# Patient Record
Sex: Male | Born: 1985 | Race: White | Hispanic: No | Marital: Single | Smoking: Never smoker
Health system: Southern US, Community
[De-identification: ages and names within clinical notes are randomized; demographics above are authoritative.]

## PROBLEM LIST (undated history)

## (undated) DIAGNOSIS — K219 Gastro-esophageal reflux disease without esophagitis: Secondary | ICD-10-CM

## (undated) DIAGNOSIS — F909 Attention-deficit hyperactivity disorder, unspecified type: Secondary | ICD-10-CM

## (undated) HISTORY — PX: TONSILLECTOMY: SUR1361

## (undated) HISTORY — PX: CHOLECYSTECTOMY: SHX55

---

## 2004-11-13 ENCOUNTER — Ambulatory Visit: Payer: Self-pay | Admitting: Pediatrics

## 2005-03-01 ENCOUNTER — Emergency Department (HOSPITAL_COMMUNITY): Admission: EM | Admit: 2005-03-01 | Discharge: 2005-03-01 | Payer: Self-pay | Admitting: Emergency Medicine

## 2005-05-05 ENCOUNTER — Ambulatory Visit: Payer: Self-pay | Admitting: Pediatrics

## 2005-05-25 ENCOUNTER — Emergency Department (HOSPITAL_COMMUNITY): Admission: EM | Admit: 2005-05-25 | Discharge: 2005-05-25 | Payer: Self-pay | Admitting: Emergency Medicine

## 2005-09-15 ENCOUNTER — Ambulatory Visit: Payer: Self-pay | Admitting: Pediatrics

## 2005-11-07 ENCOUNTER — Inpatient Hospital Stay (HOSPITAL_COMMUNITY): Admission: EM | Admit: 2005-11-07 | Discharge: 2005-11-10 | Payer: Self-pay | Admitting: Emergency Medicine

## 2005-11-09 ENCOUNTER — Encounter (INDEPENDENT_AMBULATORY_CARE_PROVIDER_SITE_OTHER): Payer: Self-pay | Admitting: *Deleted

## 2006-01-28 ENCOUNTER — Ambulatory Visit: Payer: Self-pay | Admitting: Pediatrics

## 2006-07-01 ENCOUNTER — Ambulatory Visit (HOSPITAL_BASED_OUTPATIENT_CLINIC_OR_DEPARTMENT_OTHER): Admission: RE | Admit: 2006-07-01 | Discharge: 2006-07-02 | Payer: Self-pay | Admitting: Otolaryngology

## 2006-07-01 ENCOUNTER — Encounter (INDEPENDENT_AMBULATORY_CARE_PROVIDER_SITE_OTHER): Payer: Self-pay | Admitting: *Deleted

## 2006-08-16 ENCOUNTER — Ambulatory Visit: Payer: Self-pay | Admitting: Pediatrics

## 2007-01-10 ENCOUNTER — Ambulatory Visit: Payer: Self-pay | Admitting: Pediatrics

## 2007-03-17 IMAGING — CR DG ABDOMEN ACUTE W/ 1V CHEST
3 series · 3 of 3 positions shown · non-contrast
Comparison: none

CLINICAL DATA: Abdominal pain.
 ACUTE ABDOMINAL SERIES WITH CHEST ? 11/07/2005 ? ([DATE] HOURS):

[w chest pa]
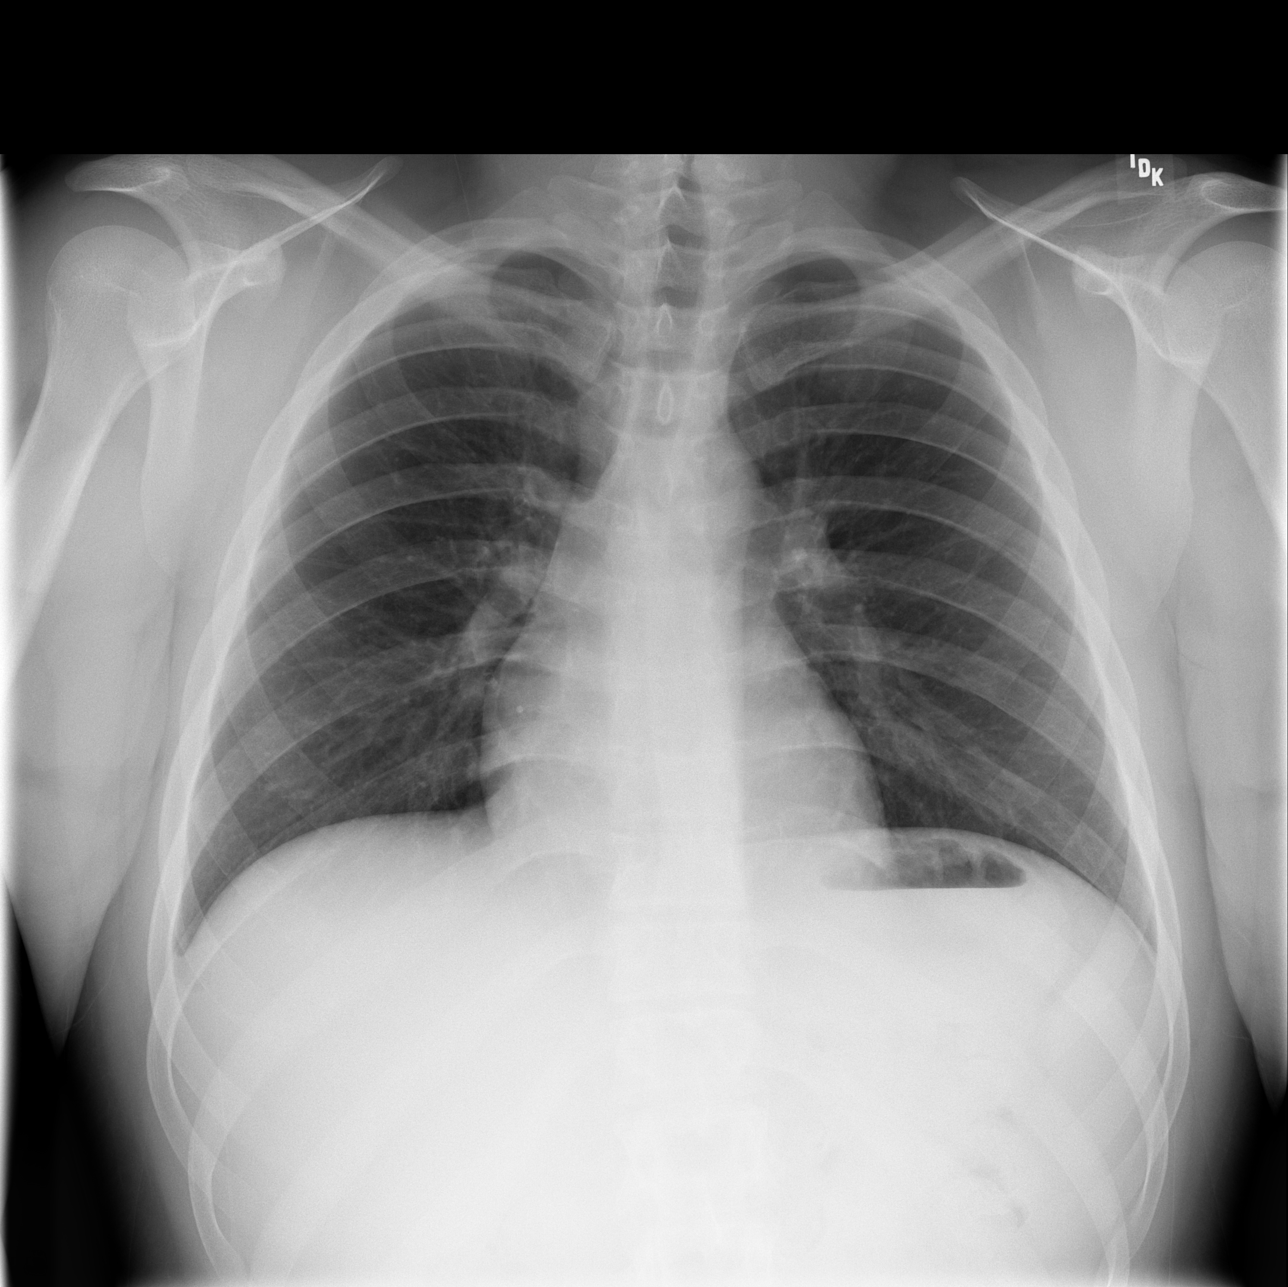

[w abdomen upright]
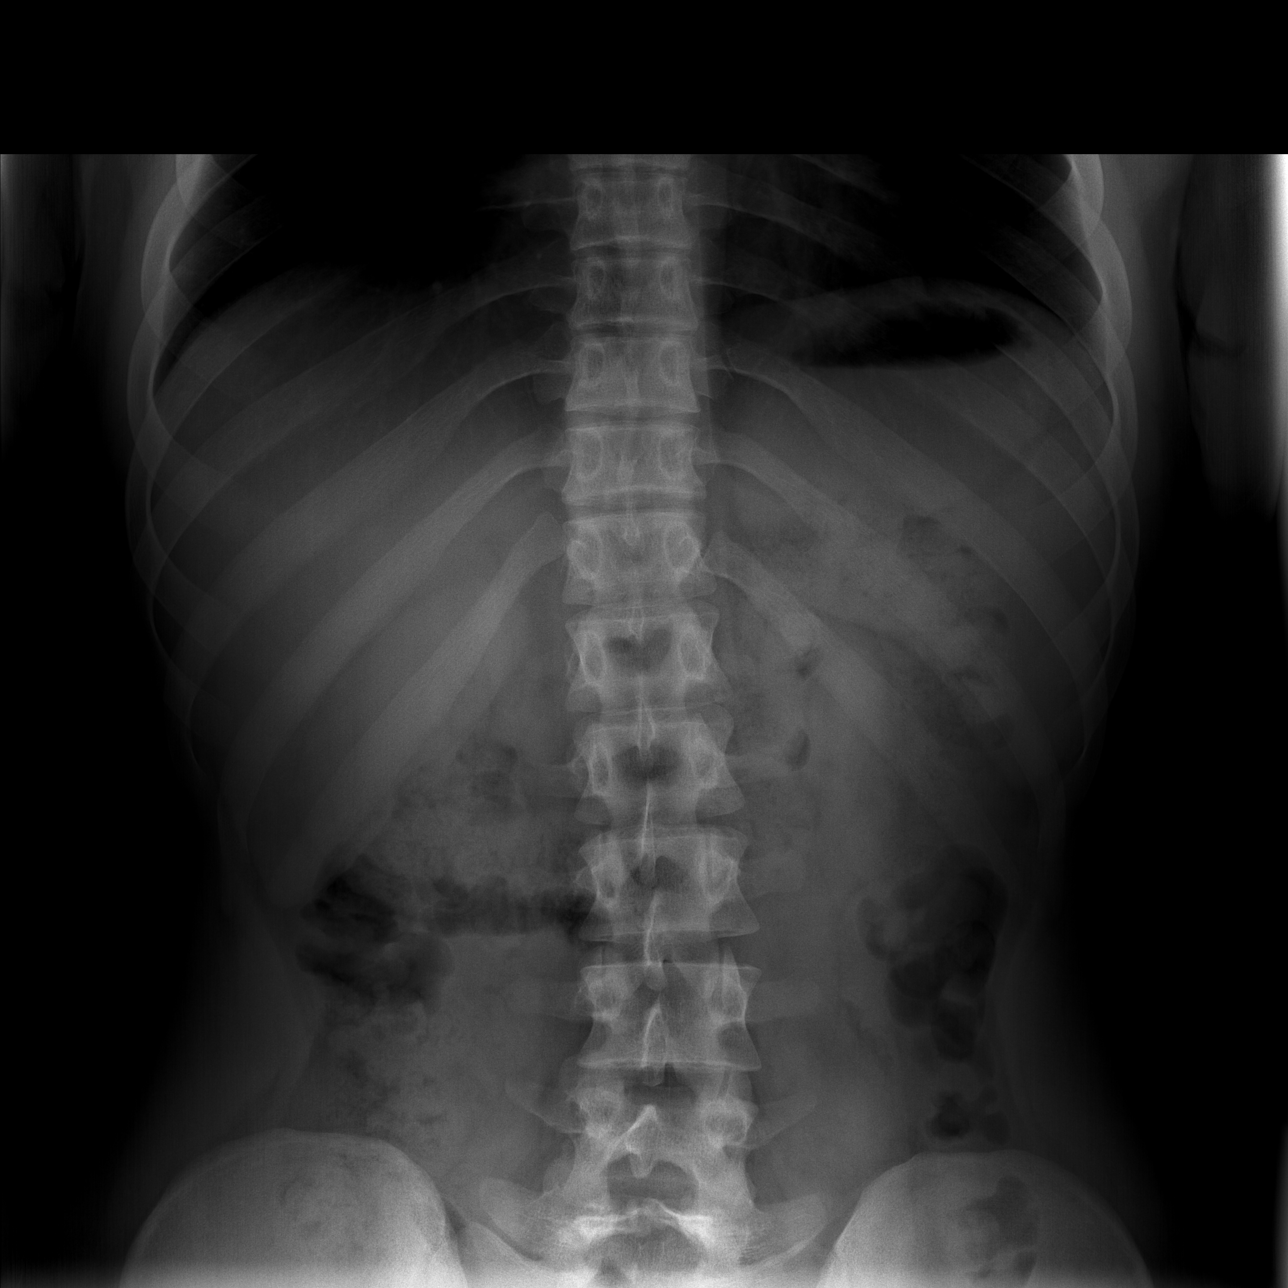

[t abdomen supine]
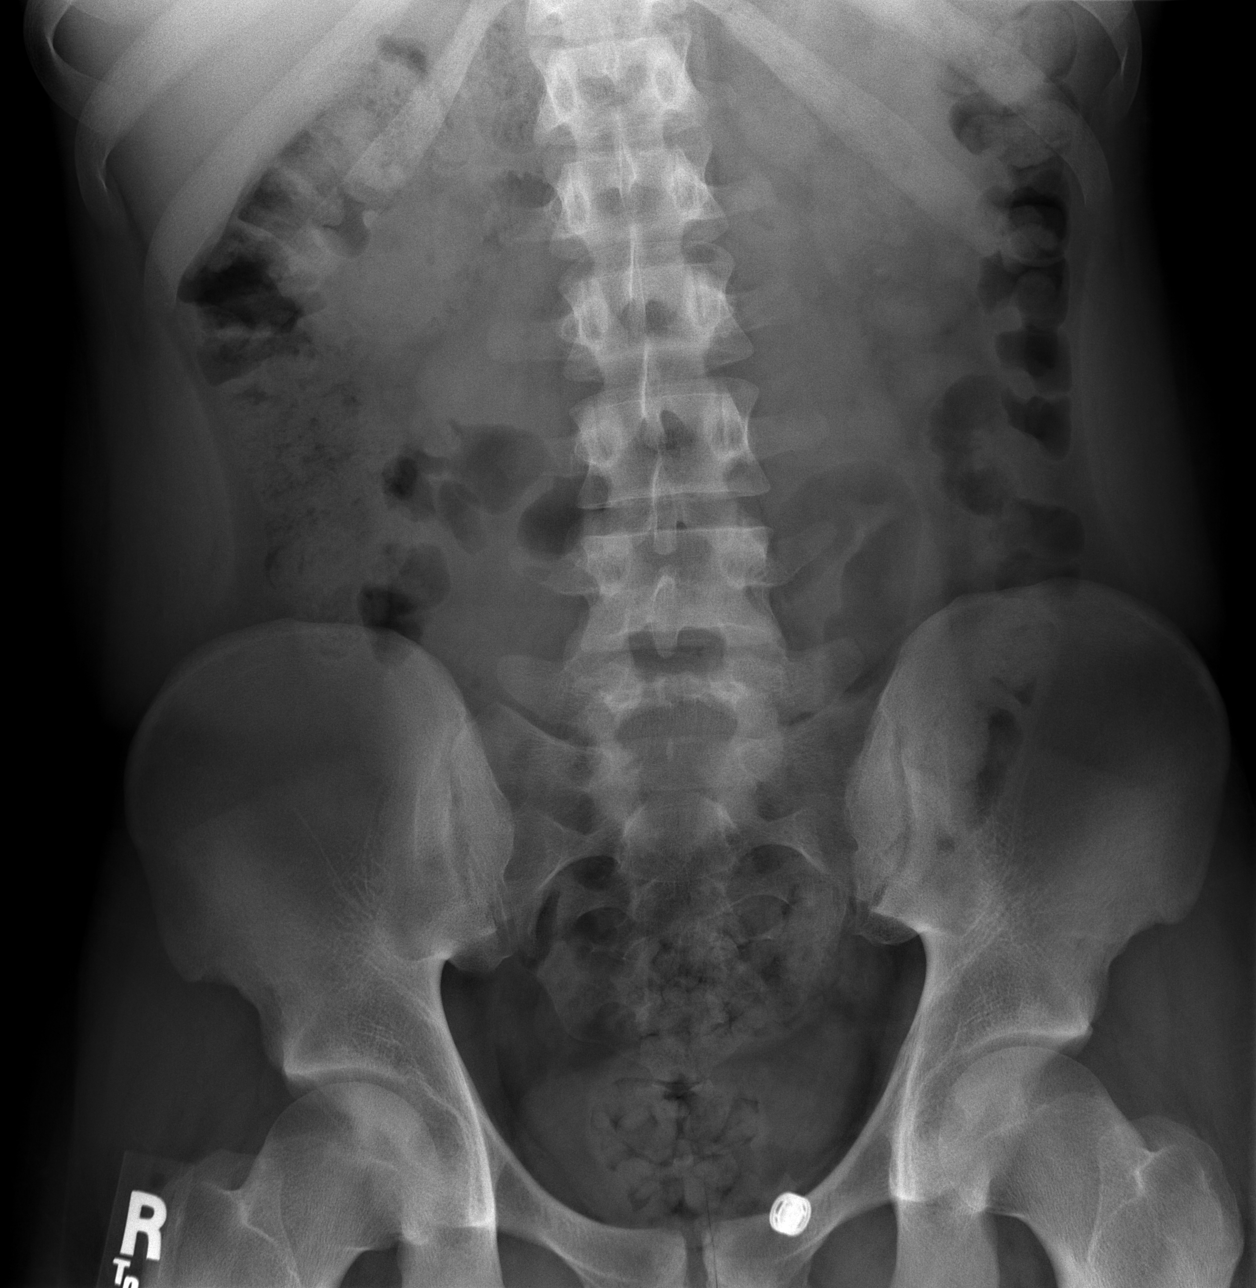

[3 of 3 positions shown; findings below may reference images not displayed]

FINDINGS: The heart is normal in size.  The lungs are clear.  No pneumothoraces.
 The abdomen, non-dilated gas-filled loops of small and large bowel are seen.  There are disproportionately dilated loops of bowel or air-fluid levels.  There is no free intraperitoneal gas.  Transitional anatomy is present at the lumbosacral junction.
IMPRESSION: No acute cardiopulmonary disease.  Normal bowel gas pattern.

## 2007-03-18 IMAGING — RF DG ERCP WO/W SPHINCTEROTOMY
1 series · 1 of 1 positions shown · non-contrast
Comparison: none

CLINICAL DATA: Cholecystitis.  Pancreatitis.  
 ERCP AND SPHINCTEROTOMY:

[Series 2: run · 1 of 1 slices shown]
[im 1/1]
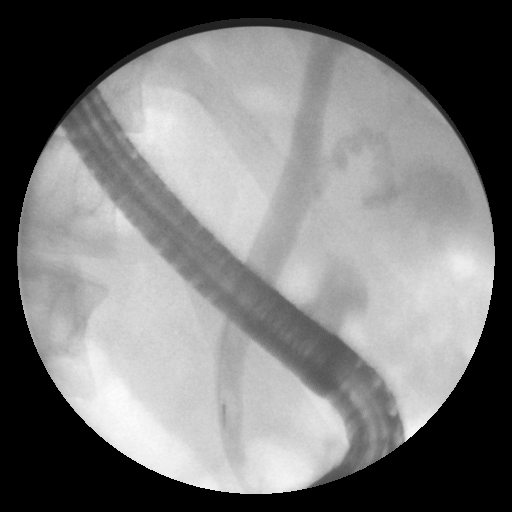

[1 of 1 positions shown; findings below may reference images not displayed]

FINDINGS: A single spot image is being interpreted.  Contrast is seen filling the common duct and cystic duct.  No definite common duct stones are seen.
IMPRESSION: ERCP as described.

## 2007-03-19 IMAGING — RF DG CHOLANGIOGRAM OPERATIVE
1 series · 5 of 5 positions shown · non-contrast
Comparison: none

CLINICAL DATA: Intraoperative.
 INTRAOPERATIVE CHOLANGIOGRAM ? 11/09/05:
TECHNIQUE: Multiple fluoroscopic spot radiographs were obtained during intraoperative cholangiogram, and are submitted for interpretation post-operatively.  C-arm run from the operating room is reviewed on PACS.

[Series 1: run · 2 acquisitions, 5 frames shown]
[im 1/2]
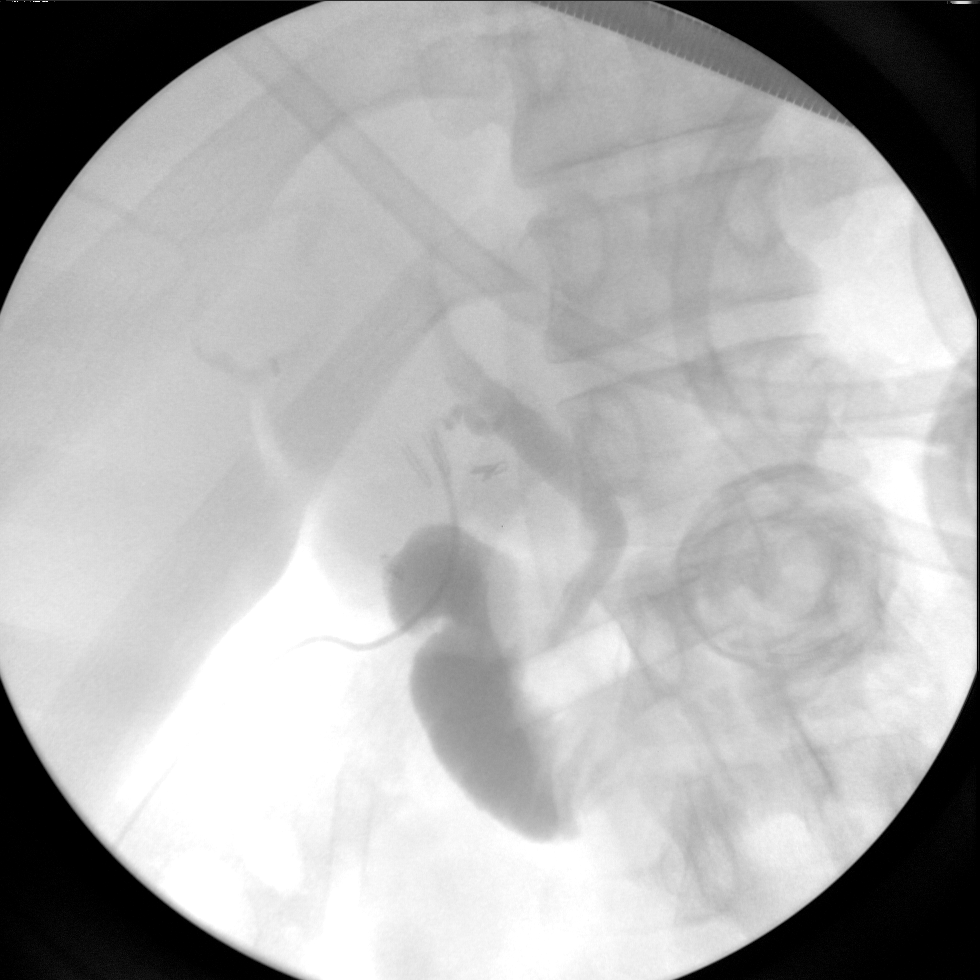
[im 1/2]
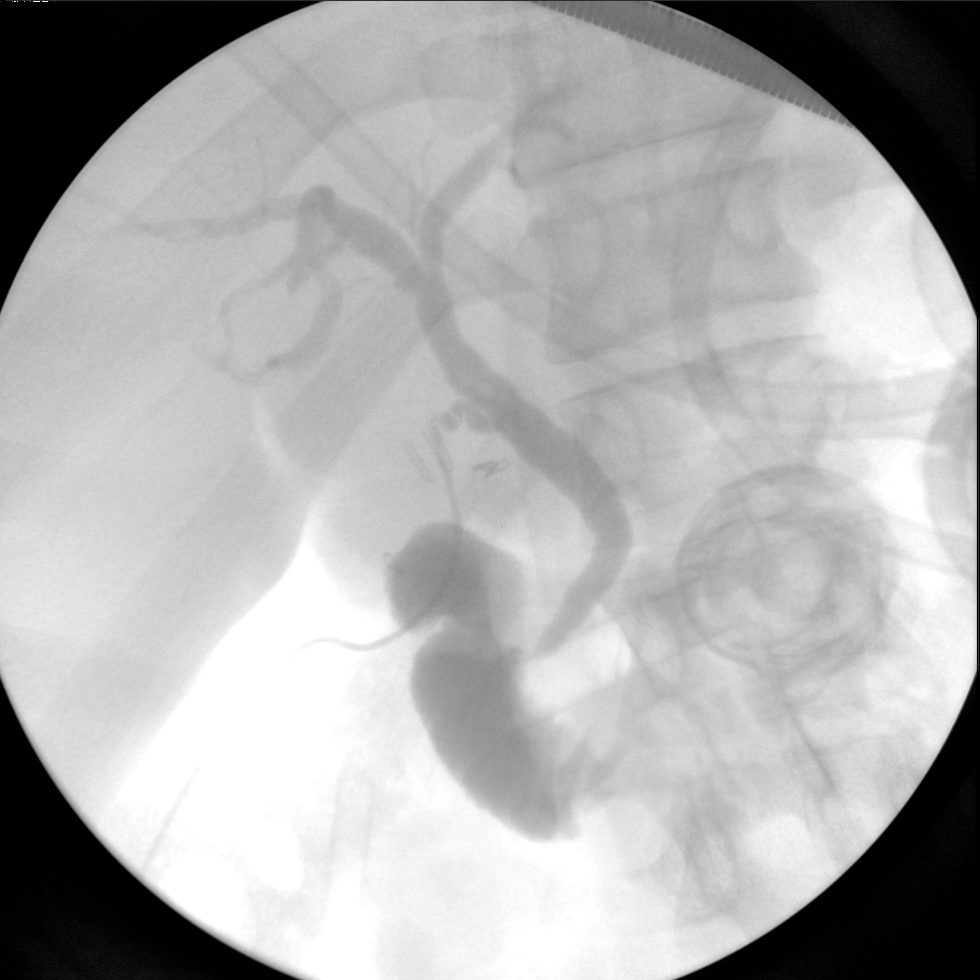
[im 1/2]
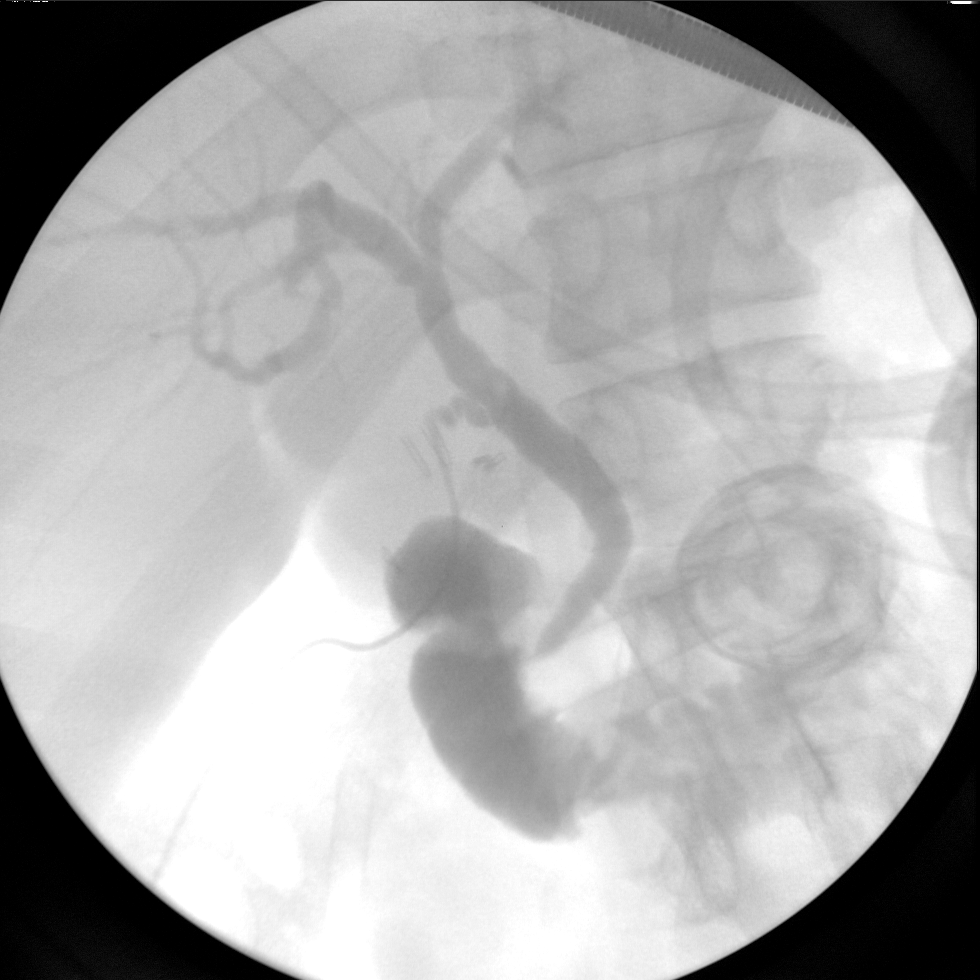
[im 1/2]
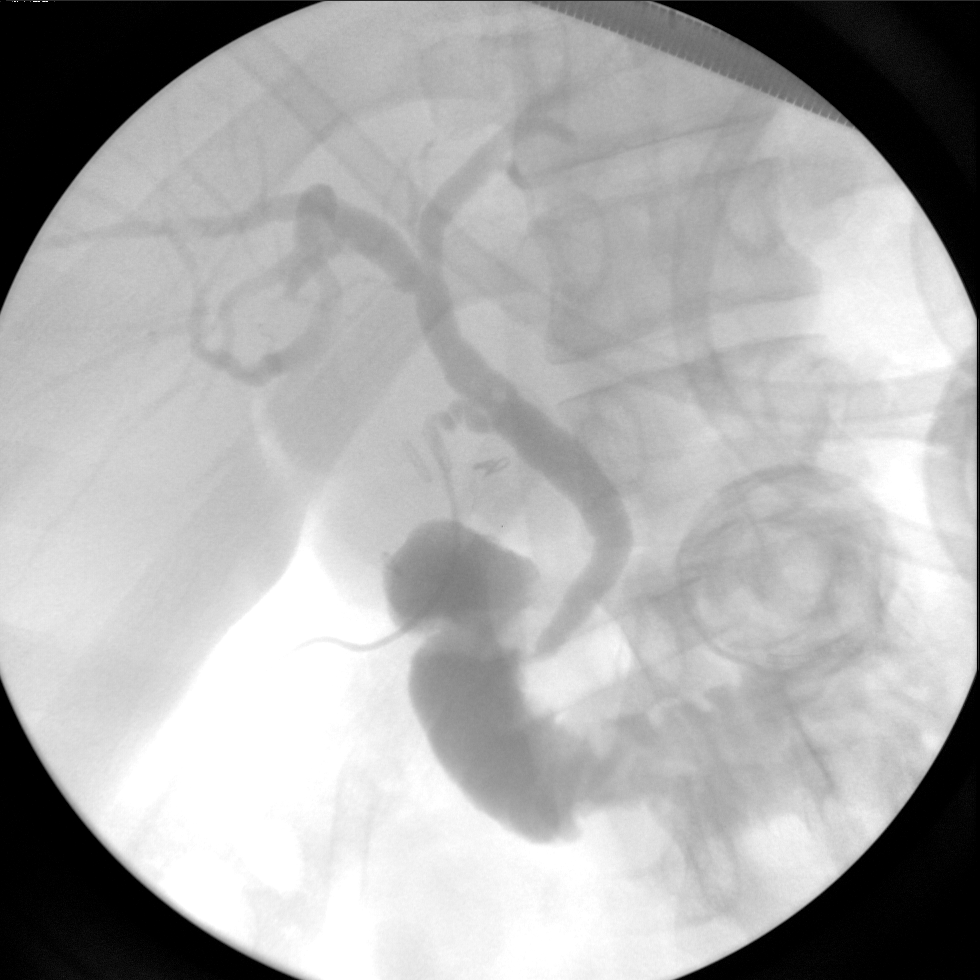
[im 2/2]
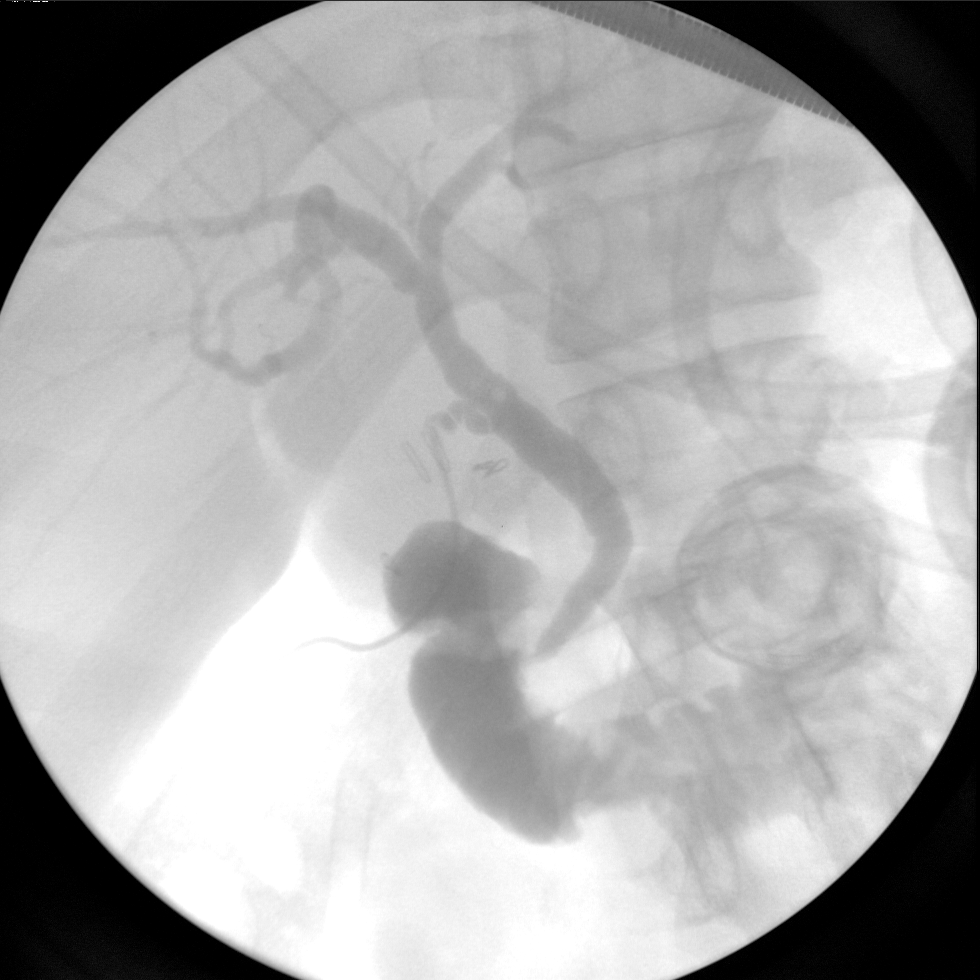

[5 of 5 positions shown; findings below may reference images not displayed]

FINDINGS: Intra- and extrahepatic biliary ducts are within normal limits.  There are one or more lucencies near the cystic duct insertion site.  These are most likely air bubbles.  I cannot exclude small stones.  There is no biliary obstruction.
IMPRESSION: There are several small lucencies noted in the biliary tree near the cystic duct ? likely air bubbles but cannot rule out several small stones.

## 2007-06-21 ENCOUNTER — Ambulatory Visit: Payer: Self-pay | Admitting: Pediatrics

## 2007-11-10 ENCOUNTER — Ambulatory Visit: Payer: Self-pay | Admitting: Pediatrics

## 2008-04-12 ENCOUNTER — Ambulatory Visit: Payer: Self-pay | Admitting: *Deleted

## 2008-08-02 ENCOUNTER — Ambulatory Visit: Payer: Self-pay | Admitting: *Deleted

## 2008-10-09 ENCOUNTER — Ambulatory Visit: Payer: Self-pay | Admitting: *Deleted

## 2009-03-06 ENCOUNTER — Ambulatory Visit: Payer: Self-pay | Admitting: Pediatrics

## 2009-05-28 ENCOUNTER — Ambulatory Visit: Payer: Self-pay | Admitting: Pediatrics

## 2009-09-03 ENCOUNTER — Ambulatory Visit: Payer: Self-pay | Admitting: Pediatrics

## 2009-10-03 ENCOUNTER — Ambulatory Visit: Payer: Self-pay | Admitting: Pediatrics

## 2010-01-08 ENCOUNTER — Ambulatory Visit: Payer: Self-pay | Admitting: Pediatrics

## 2010-04-09 ENCOUNTER — Ambulatory Visit: Payer: Self-pay | Admitting: Pediatrics

## 2010-09-11 ENCOUNTER — Ambulatory Visit: Payer: Self-pay | Admitting: Pediatrics

## 2011-04-24 NOTE — Consult Note (Signed)
[name], [name]               ACCOUNT NO.:  [id number]   MEDICAL RECORD NO.:  [id number]          PATIENT TYPE:  INP   LOCATION:  5729                         FACILITY:  [facility]   PHYSICIAN:  John C. Madilyn Fireman, M.D.    DATE OF BIRTH:  1986/08/01   DATE OF CONSULTATION:  11/08/2005  DATE OF DISCHARGE:                                   CONSULTATION   REASON FOR CONSULTATION:  Suspected common bile duct stone.   HISTORY OF ILLNESS:  The patient is a 25 year old white male, who comes to  the Emergency Room with epigastric pain that awoke him from sleep in the  middle of the night, and it was persistent.  He had an abdominal ultrasound,  which showed gallstones, a slightly prominent gallbladder wall with common  bile duct dilatation.  His LFTs originally showed bilirubin 0.7, alk phos  65, SGPT 47, SGOT 66.  On repeat this morning, bilirubin had risen to 3.38,  FT 116, ALT 139.  Lipase was originally slightly high at ____________  antibodies were 46.  ERCP is requested for suspected common bile duct or  conduct a stone test.  The patient is currently pain free.  His white blood  cell count was 10,300 on admission, afebrile.   PAST MEDICAL HISTORY:  ADHD.   MEDICATIONS:  Adoral.   SURGICAL:  None.   SOCIAL HISTORY:  The patient denies alcohol or tobacco use.   FAMILY HISTORY:  Non-contributory.   PHYSICAL EXAMINATION:  GENERAL:  Well-developed, well-nourished white male  in no acute distress.  HEART:  Regular rate and rhythm.  No murmur.  LUNGS:  Clear.  ABDOMEN:  Soft, non-distended, normoactive bowel sounds. ___________.   IMPRESSION:  Cholelithiasis, possible cholecystitis and suspected common  bile duct stone.   PLAN:  Agree with the ERCP.  Risks/rationale were mentioned and alternatives  were explained to the patient, and he agrees to proceed.  This will be done  later today.           ______________________________  Everardo All. Madilyn Fireman, M.D.     JCH/MEDQ  D:  11/08/2005   T:  11/09/2005  Job:  [id number]   cc:   Ollen Gross. Vernell Morgans, M.D.  [address]  [place]  [place] [address]

## 2011-04-24 NOTE — H&P (Signed)
[name], [name]               ACCOUNT NO.:  [id number]   MEDICAL RECORD NO.:  [id number]          PATIENT TYPE:  INP   LOCATION:  5729                         FACILITY:  [facility]   PHYSICIAN:  Ollen Gross. Vernell Morgans, M.D. DATE OF BIRTH:  21-Feb-1986   DATE OF ADMISSION:  11/07/2005  DATE OF DISCHARGE:                                HISTORY & PHYSICAL   [name] is a 25 year old white male who presents to the emergency  department today with severe epigastric pain that started this morning.  He  has not really had any nausea or vomiting associated with it but the pain  has been very severe.  He denies any nausea, vomiting, fever, chills, chest  pain, shortness of breath, diarrhea, or dysuria.  He has had two similar  episodes of pain in the last year.   REVIEW OF SYSTEMS:  The rest of his review of systems are unremarkable.   PAST MEDICAL HISTORY:  None.   PAST SURGICAL HISTORY:  None.   MEDICATIONS:  Adderall XR 60 mg a day.   ALLERGIES:  No known drug allergies.   SOCIAL HISTORY:  He denies the use of alcohol or tobacco products.   FAMILY HISTORY:  Noncontributory.   PHYSICAL EXAMINATION:  VITAL SIGNS:  His temp is 97.2, blood pressure  134/82, pulse of 100.  GENERAL:  He is a well-developed, well-nourished, black male in no acute  distress.  SKIN:  Warm and dry.  No jaundice.  EYES:  Extraocular muscles intact.  Pupils are equal, round, and reactive to  light.  Sclerae nonicteric.  LUNGS:  Clear bilaterally with no use of accessory respiratory muscles.  HEART:  Regular rate and rhythm with an impulse in the left chest.  ABDOMEN:  Soft with mild to moderate epigastric tenderness.  No guarding or  peritoneal signs.  No palpable mass or hepatosplenomegaly.  EXTREMITIES:  No cyanosis, clubbing, or edema with good strength in his arms  and legs.  PSYCHOLOGICAL:  He is alert and oriented x3 with no evidence of today of  anxiety or depression.   On review of his ultrasound, he  was noted to have stones in his gallbladder  and possibly some slight common duct dilatation but no gallbladder wall  thickening.   LABORATORY DATA:  Significant for a white count of 10.2.  SGOT of 66, SGPT  of 47, alk phos of 65, total bili 0.7.  Lipase 76.   ASSESSMENT:  This is a 25 year old white male with what sounds like a mild  case of gallstone pancreatitis.   PLAN:  1.  We will plan to admit him for IV hydration, pain control, and bowel      rest.  2.  We will recheck his liver functions and amylase and lipase in the      morning and if these have resolved, then he may benefit from      cholecystectomy during this admission.  I have discussed the risks and      benefits of the surgery as well as some of the technical aspects of  removing the gallbladder with him and he understands and agrees with      this treatment plan.      Ollen Gross. Vernell Morgans, M.D.  Electronically Signed     PST/MEDQ  D:  11/07/2005  T:  11/07/2005  Job:  [id number]

## 2011-04-24 NOTE — Op Note (Signed)
NAMECLARKSON, [name]               ACCOUNT NO.:  [id number]   MEDICAL RECORD NO.:  [id number]          PATIENT TYPE:  AMB   LOCATION:  DSC                          FACILITY:  [facility]   PHYSICIAN:  Jefry H. Pollyann Kennedy, MD     DATE OF BIRTH:  1986/07/16   DATE OF PROCEDURE:  07/01/2006  DATE OF DISCHARGE:                                 OPERATIVE REPORT   PREOPERATIVE DIAGNOSIS:  Chronic tonsillitis.   POSTOPERATIVE DIAGNOSIS:  Chronic tonsillitis.   PROCEDURE:  Tonsillectomy.   SURGEON:  Jefry H. Pollyann Kennedy, M.D.   ANESTHESIA:  General endotracheal anesthesia was used.   COMPLICATIONS:  No complications.   BLOOD LOSS:  None.   FINDINGS:  Large tonsils, with deep cryptic spaces, and a large amount of  tonsillolithiasis in the right tonsil.   REFERRING PHYSICIAN:  Darius Bump, M.D.   HISTORY:  A 25 year old with a history of longstanding chronic and recurring  tonsillopharyngitis, streptococcal and non-streptococcal.  Risks, benefits,  alternatives, and complications of procedure were explained to the patient  and his family, who seemed to understand and agreed with surgery.   PROCEDURE:  The patient was taken to the operating room and placed on the  operating table in the supine position.  Following induction of general  endotracheal anesthesia, the table was turned, and the patient was draped in  standard fashion.  A Crowe-Davis mouth gag was inserted into the oral  cavity, used to retract the tongue and mandible, and attached to the Mayo  stand.  Red rubber catheter was inserted into the right side of the nose,  withdrawn through the mouth, and used to retract the soft palate and uvula.  Tonsillectomy was performed using electrocautery dissection, carefully  dissecting the avascular plane between the capsule and constrictor muscles.  Cautery was used in certain spots to provide completion of hemostasis.  The  tonsils were sent together for pathologic evaluation.  Pharynx was  irrigated  with saline and aspirated, and an orogastric tube used to aspirate the  contents of the stomach.  The patient was then awakened, extubated, and  transferred to recovery in stable condition.      Jefry H. Pollyann Kennedy, MD  Electronically Signed     JHR/MEDQ  D:  07/01/2006  T:  07/01/2006  Job:  [id number]   cc:   Darius Bump, M.D.

## 2011-04-24 NOTE — Discharge Summary (Signed)
[name], [name]               ACCOUNT NO.:  [id number]   MEDICAL RECORD NO.:  [id number]          PATIENT TYPE:  INP   LOCATION:  5729                         FACILITY:  [facility]   PHYSICIAN:  Angelia Mould. Derrell Lolling, M.D.DATE OF BIRTH:  03-31-1986   DATE OF ADMISSION:  11/07/2005  DATE OF DISCHARGE:  11/10/2005                                 DISCHARGE SUMMARY   FINAL DIAGNOSES:  1.  Biliary pancreatitis.  2.  Acute and chronic cholecystitis with cholelithiasis.   PROCEDURE:  Laparoscopic cholecystectomy with cholangiogram on November 09 2005.   HISTORY OF PRESENT ILLNESS:  This is a 25 year old white man who presented  to the [facility] emergency room with epigastric pain that started several hours  earlier in the day.  He is otherwise healthy.   PHYSICAL EXAMINATION:  GENERAL:  He was found to be alert, to be healthy-  appearing but in mild-to-moderate distress.  ABDOMEN:  Exam revealed moderate epigastric tenderness and guarding.  [name] SIGNS:  [name] signs were stable.   HOSPITAL COURSE:  The patient was admitted.  Lab work revealed mild  elevation of liver function tests and mild elevation of lipase.  Abdominal  ultrasound showed gallstones and thickening of the gallbladder wall.  The  pancreas was not well-seen.  The common bile duct was dilated.  He was  thought to have possible pancreatitis and possible acute cholecystitis  associated with his gallstones.  He was admitted, placed on antibiotics, and  bowel rest.  The following morning, he was reevaluated.  Pain had improved.  Bilirubin had gone from 0.7 to 3.3, and his other liver function tests were  higher as well, although the lipase had gone from 76 down to 17.  A GI  consult was obtained with Dr. Dorena Cookey.  An ERCP was performed and noted  that the papilla looked somewhat abnormal, suggesting that maybe he passed a  stone through the ampulla.  He did do a cholangiogram which showed two small  filling defects and thought  these were air bubbles.  A sphincterotomy was  performed.  He felt that the procedure went well, and there were no apparent  post-procedure complications.   The patient was taken to the operating room on November 09, 2005 and  underwent a laparoscopic cholecystectomy with cholangiogram.  In the  cholangiogram, there was one air bubble, but we did not see any filling  defect or obstruction.  The final pathology report showed acute  cholecystitis with cholelithiasis.   Postoperatively, the patient did well.  On November 10, 2005, he was feeling  better, tolerated a diet, was stable, was afebrile, the abdomen was soft and  benign.  Lab work showed his white count was 10,500, total bilirubin was  down to 1.2.   DISPOSITION:  He was discharged home on November 10, 2005, feeling well.   DIET AND ACTIVITY:  Diet and activities were discussed.   DISCHARGE MEDICATIONS:  He was given a prescription for analgesics.   FOLLOW UP:  He was asked to return to the office in three weeks.      [name]  Grayce Sessions, M.D.  Electronically Signed     HMI/MEDQ  D:  02/04/2006  T:  02/04/2006  Job:  [id number]   cc:   Everardo All. Madilyn Fireman, M.D.  Fax: [phone]

## 2011-04-24 NOTE — Op Note (Signed)
[name], [name]               ACCOUNT NO.:  [id number]   MEDICAL RECORD NO.:  [id number]          PATIENT TYPE:  INP   LOCATION:  5729                         FACILITY:  [facility]   PHYSICIAN:  Angelia Mould. Derrell Lolling, M.D.DATE OF BIRTH:  November 26, 1986   DATE OF PROCEDURE:  11/09/2005  DATE OF DISCHARGE:  11/10/2005                                 OPERATIVE REPORT   PREOPERATIVE DIAGNOSIS:  Acute cholecystitis with cholelithiasis, suspect  choledocholithiasis.   POSTOPERATIVE DIAGNOSIS:  Acute cholecystitis with cholelithiasis, suspect  choledocholithiasis.   OPERATION PERFORMED:  Laparoscopic cholecystectomy with intraoperative  cholangiogram.   SURGEON:  Angelia Mould. Derrell Lolling, M.D.   FIRST ASSISTANT:  Cherylynn Ridges, M.D.   OPERATIVE INDICATIONS:  This is a 25 year old white man who was admitted to  this hospital on November 07, 2005 with a less than 24-hour history of  epigastric pain. He had mild elevation of his liver function test and his  serum lipase. His ultrasound showed a thickened gallbladder wall with  numerous gallstones. He was admitted and initially treated conservatively  with thoughts that he might have gallstone pancreatitis. Yesterday his lab  work showed that lipase had returned to normal, but his bilirubin had gone  up to 3.3. Dr. Dorena Cookey saw him and performed an ERCP and sphincterotomy.  He never saw any stones and thought there was loss of air bubbles up in the  common duct. The patient became combative and they abandoned the ERCP  without doing a post procedure cholangiogram. This morning the patient was  asymptomatic denying pain and stating that he was hungry his bilirubin and  fallen down to about 2.5. He was brought to operating room for a  cholecystectomy.   OPERATIVE FINDINGS:  The gallbladder was unusually positioned, in that it  was immediately under the falciform ligament, making it much more medial  than usual. The gallbladder was thick-walled, acutely  inflamed, and  contained numerous stones. The cholangiogram was normal showing normal  intrahepatic and extrahepatic bile ducts, no obstruction with good flow of  contrast into duodenum, and the way filling defect we saw was a small air  bubble. The liver looked healthy. The stomach and duodenum and small  intestine and large intestine looked normal.   OPERATIVE TECHNIQUE:  Following the induction of general endotracheal  anesthesia the patient's abdomen was prepped and draped in a sterile  fashion. Marcaine 0.5%  was used as an infiltration anesthetic. A vertically  oriented incision was made at the upper rim of the umbilicus. The fascia was  incised in midline and the abdominal cavity entered under direct vision. A  10-mm Hassan trocar was inserted and secured with a pursestring suture of #0  Vicryl. Pneumoperitoneum was created. Video camera was inserted with  visualization and findings as described above. An 11-mm trocar was placed in  the subxiphoid region and two 5-mm trocars placed in the right mid abdomen.   The gallbladder was elevated. There were extensive adhesions to the  gallbladder. These had to be taken down very carefully. We ultimately were  able to dissect out the infundibulum of  the gallbladder and the cystic duct.  We created a nice window behind the cystic duct. The cholangiogram catheter  was inserted into the cystic duct. A cholangiogram was obtained using the C-  arm. The cholangiogram showed normal intrahepatic and extrahepatic biliary  anatomy, no obstruction with good flow of contrast into the duodenum; and  the only filling defect we saw was one tiny air bubble. We did not see any  stones. The cholangiogram catheter was removed. The cystic duct was secured  with multiple metal clips and divided. We dissected out the cystic artery,  secured it with multiple metal clips, and divided it. We then dissected the  gallbladder from its bed with electrocautery. The  dissection plane was very  difficult because of thickening of the gallbladder wall. We got into the  liver substance a couple of times and had some venous bleeders, which were  controlled with electrocautery and Surgicel. Ultimately the gallbladder was  completely dissected out; placed in the specimen bag and removed.   We spent about 10 or 15 minutes irrigating out all the fluid under the liver  and above the liver until the irrigation fluid was completely clear. We  carefully inspected the bed of the gallbladder and the area, where we had  secured the cystic duct and cystic artery, and it seemed to perfectly fine;  there was no bleeding or bile leak whatsoever. Because we had cauterized a  fairly significant raw surface of the liver and the gallbladder bed, we  placed a piece of Surgicel gauze in the gallbladder bed and waited for about  5 minutes. There was no bleeding. The pneumoperitoneum was released. The  trocars were removed under direct vision; and there was no bleeding from  trocar sites. The fascia at the umbilicus was closed with #0 Vicryl suture.  The skin incisions were irrigated out saline, and closed with subcuticular  sutures of 4-0 Monocryl and Steri-Strips. Clean bandages were placed; and  the patient taken to the recovery room in stable condition. Estimated blood  loss about 30 mL. Complications none.  Sponge and instrument counts were  correct.      Angelia Mould. Derrell Lolling, M.D.  Electronically Signed     HMI/MEDQ  D:  11/09/2005  T:  11/10/2005  Job:  [id number]   cc:   Everardo All. Madilyn Fireman, M.D.  Fax: [phone]

## 2011-04-24 NOTE — Op Note (Signed)
[name], [name]               ACCOUNT NO.:  [id number]   MEDICAL RECORD NO.:  [id number]          PATIENT TYPE:   LOCATION:                                 FACILITY:   PHYSICIAN:  John C. Madilyn Fireman, M.D.    DATE OF BIRTH:  1986-01-30   DATE OF PROCEDURE:  11/07/2005  DATE OF DISCHARGE:                                 OPERATIVE REPORT   PROCEDURE:  _________   INDICATIONS FOR PROCEDURE:  Cholelithiasis with possible cholecystitis,  elevated liver function tests, question of a common bile duct stone.   PROCEDURE:  The patient was placed in the prone position and placed on a  pulse monitor with continuous low flow oxygen by nasal cannula. He was  sedated with 150 mcg of IV Fentanyl and 12.5 mg of IV Versed as well as  __________ Phenergan.  Olympus video side-viewing endoscope was passed  __________ the oropharynx into the esophagus and stomach. The patient was  __________   __________   The stomach appeared grossly normal.  The ampulla of Vater was located on  the __________ wall.  __________ ampulla of Vater located on the __________  wall had somewhat traumatized look, suggesting the possibility of stone  passed through it.  Intermediate cannulation was obtained ___________  cholangiogram obtained.  I could not initially pass the wire high or freely  into the common bile duct and could not get a wire to advance initially  behind the tip of the cannula, but a selective cholangiogram was obtained  revealing a couple of __________ filling defects.  These appeared to  increase in number with subsequent injection of __________ deep cannulation  and it was felt that some or most of these were almost certainly air  bubbles.  Finally we obtained deep cannulation with guide wire and performed  a large sphincterotomy.  Some of the filling defects that were seen in the  distal duct were seen to come through the papilla as air bubbles, but no  stones were seen in the pouch.  No  pancreatogram was obtained and dye was  not seen to enter the gallbladder.  The sphincterotome was exchanged for a  balloon catheter but as the balloon was being introduced up into the duct,  the patient ___________ sat up literally, came off the table, dislodging the  catheter, wire and scope back into the stomach.  Thinking that most if not  all of the remaining filling defects were air bubbles and that the  sphincterotomy was probably large enough to allow passage of the remaining  stones, I elected not to give him greater amounts of medication given that  he had had some brief oxygen desaturation during the procedure.  Therefore,  we terminated the procedure.  The scope was withdrawn and the patient  returned to the recovery room in stable condition.  With the above listed  exceptions, he tolerated the procedure well.  There were no __________  complications.   IMPRESSION:  Possible common bile duct stone versus air bubbles with  definite introduction of air bubble during the procedure, status post large  sphincterotomy unable to keep the patient adequately sedated to definitely  clear the duct of stones.   PLAN:  We will discuss with general surgery and recommend a through  intraoperative cholangiogram and attempted __________ of any retained stones  at the time of surgery.  Will also be recheck liver function tests tomorrow.           ______________________________  Everardo All Madilyn Fireman, M.D.     JCH/MEDQ  D:  11/08/2005  T:  11/09/2005  Job:  [id number]

## 2016-03-13 ENCOUNTER — Other Ambulatory Visit: Payer: Self-pay | Admitting: Gastroenterology

## 2017-09-22 ENCOUNTER — Emergency Department
Admission: EM | Admit: 2017-09-22 | Discharge: 2017-09-22 | Disposition: A | Discharge status: Home / Self Care | Payer: BLUE CROSS/BLUE SHIELD | Source: Home / Self Care

## 2017-09-22 DIAGNOSIS — J029 Acute pharyngitis, unspecified: Secondary | ICD-10-CM

## 2017-09-22 HISTORY — DX: Attention-deficit hyperactivity disorder, unspecified type: F90.9

## 2017-09-22 HISTORY — DX: Gastro-esophageal reflux disease without esophagitis: K21.9

## 2017-09-22 MED ORDER — AMOXICILLIN 500 MG PO CAPS: 500.0000 mg | ORAL_CAPSULE | Freq: Three times a day (TID) | ORAL | 0 refills | Status: AC

## 2017-09-22 NOTE — Discharge Instructions (Signed)
Return if any problems.

## 2017-09-22 NOTE — ED Triage Notes (Signed)
Pt c/o cough, congestion, fatigue, and HA since Sunday. Took Mucinex today at noon.

## 2017-09-22 NOTE — ED Provider Notes (Signed)
[name]    CSN: [id number] Arrival date & time: 09/22/17  1435     History   Chief Complaint Chief Complaint  Patient presents with  . URI    HPI [name] is a 31 y.o. male.   The history is provided by the patient. No language interpreter was used.  URI  Presenting symptoms: congestion and cough   Severity:  Moderate Onset quality:  Gradual Duration:  4 days Timing:  Constant Progression:  Worsening Chronicity:  New Relieved by:  Nothing Worsened by:  Nothing Ineffective treatments:  None tried Risk factors: no recent illness   Pt complains of a sore throat   Past Medical History:  Diagnosis Date  . ADHD   . GERD (gastroesophageal reflux disease)     There are no active problems to display for this patient.   Past Surgical History:  Procedure Laterality Date  . CHOLECYSTECTOMY    . TONSILLECTOMY         Home Medications    Prior to Admission medications   Medication Sig Start Date End Date Taking? Authorizing Provider  lisdexamfetamine (VYVANSE) 70 MG capsule Take 70 mg by mouth daily.   Yes [provider]  omeprazole (PRILOSEC) 20 MG capsule Take 20 mg by mouth daily.   Yes [provider]  amoxicillin (AMOXIL) 500 MG capsule Take 1 capsule (500 mg total) by mouth 3 (three) times daily. 09/22/17   Elson Areas, PA-C    Family History Family History  Problem Relation Age of Onset  . Hypertension Father     Social History Social History  Substance Use Topics  . Smoking status: Never Smoker  . Smokeless tobacco: Never Used  . Alcohol use No     Allergies   Patient has no known allergies.   Review of Systems Review of Systems  HENT: Positive for congestion.   Respiratory: Positive for cough.   All other systems reviewed and are negative.    Physical Exam Triage Vital Signs ED Triage Vitals  Enc Vitals Group     BP 09/22/17 1459 (!) 143/91     Pulse Rate 09/22/17 1459 100     Resp  09/22/17 1459 18     Temp 09/22/17 1459 99.4 F (37.4 C)     Temp Source 09/22/17 1459 Oral     SpO2 09/22/17 1459 96 %     Weight 09/22/17 1500 269 lb 8 oz (122.2 kg)     Height 09/22/17 1500 5\' 11"  (1.803 m)     Head Circumference --      Peak Flow --      Pain Score 09/22/17 1501 4     Pain Loc --      Pain Edu? --      Excl. in GC? --    No data found.   Updated Vital Signs BP (!) 143/91 (BP Location: Left Arm)   Pulse 100   Temp 99.4 F (37.4 C) (Oral)   Resp 18   Ht 5\' 11"  (1.803 m)   Wt 269 lb 8 oz (122.2 kg)   SpO2 96%   BMI 37.59 kg/m   Visual Acuity Right Eye Distance:   Left Eye Distance:   Bilateral Distance:    Right Eye Near:   Left Eye Near:    Bilateral Near:     Physical Exam  Constitutional: He appears well-developed and well-nourished.  HENT:  Head: Normocephalic and atraumatic.  Right Ear: External ear normal.  Left Ear: External ear normal.  Erythema throat tonsils abscent  Eyes: Conjunctivae are normal.  Neck: Neck supple.  Cardiovascular: Normal rate and regular rhythm.   No murmur heard. Pulmonary/Chest: Effort normal and breath sounds normal. No respiratory distress.  Abdominal: Soft. There is no tenderness.  Musculoskeletal: He exhibits no edema.  Neurological: He is alert.  Skin: Skin is warm and dry.  Psychiatric: He has a normal mood and affect.  Nursing note and vitals reviewed.    UC Treatments / Results  Labs (all labs ordered are listed, but only abnormal results are displayed) Labs Reviewed - No data to display  EKG  EKG Interpretation None       Radiology No results found.  Procedures Procedures (including critical care time)  Medications Ordered in UC Medications - No data to display   Initial Impression / Assessment and Plan / UC Course  I have reviewed the triage vital signs and the nursing notes.  Pertinent labs & imaging results that were available during my care of the patient were reviewed by  me and considered in my medical decision making (see chart for details).       Final Clinical Impressions(s) / UC Diagnoses   Final diagnoses:  Acute pharyngitis, unspecified etiology    New Prescriptions Discharge Medication List as of 09/22/2017  3:15 PM    START taking these medications   Details  amoxicillin (AMOXIL) 500 MG capsule Take 1 capsule (500 mg total) by mouth 3 (three) times daily., Starting Wed 09/22/2017, Normal       An After Visit Summary was printed and given to the patient.   Controlled Substance Prescriptions [place] Controlled Substance Registry consulted? Not Applicable   Elson AreasSofia, [place] Stenberg K, [place]PA-C 09/22/17 [id number]
